# Patient Record
Sex: Male | Born: 1995 | Race: Black or African American | Hispanic: No | Marital: Single | Smoking: Current every day smoker
Health system: Southern US, Community
[De-identification: ages and names within clinical notes are randomized; demographics above are authoritative.]

---
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
## 2017-09-07 ENCOUNTER — Emergency Department
Admission: EM | Admit: 2017-09-07 | Discharge: 2017-09-07 | Disposition: A | Discharge status: Home / Self Care | Payer: Self-pay | Attending: Emergency Medicine | Admitting: Emergency Medicine

## 2017-09-07 ENCOUNTER — Other Ambulatory Visit: Payer: Self-pay

## 2017-09-07 ENCOUNTER — Encounter: Payer: Self-pay | Admitting: Emergency Medicine

## 2017-09-07 DIAGNOSIS — Z5321 Procedure and treatment not carried out due to patient leaving prior to being seen by health care provider: Secondary | ICD-10-CM | POA: Insufficient documentation

## 2017-09-07 DIAGNOSIS — K0889 Other specified disorders of teeth and supporting structures: Secondary | ICD-10-CM | POA: Insufficient documentation

## 2017-09-07 NOTE — ED Triage Notes (Signed)
Pt presents to ED with ulcer like sores in his mouth. Pt states they are on both sides of his mouth intermittently. Denies fever. No obvious wounds or sores. Pt denies injury.

## 2017-12-16 ENCOUNTER — Encounter: Payer: Self-pay | Admitting: Emergency Medicine

## 2017-12-16 ENCOUNTER — Emergency Department
Admission: EM | Admit: 2017-12-16 | Discharge: 2017-12-16 | Disposition: A | Discharge status: Home / Self Care | Payer: Self-pay | Attending: Emergency Medicine | Admitting: Emergency Medicine

## 2017-12-16 ENCOUNTER — Other Ambulatory Visit: Payer: Self-pay

## 2017-12-16 DIAGNOSIS — F17290 Nicotine dependence, other tobacco product, uncomplicated: Secondary | ICD-10-CM | POA: Insufficient documentation

## 2017-12-16 DIAGNOSIS — L42 Pityriasis rosea: Secondary | ICD-10-CM | POA: Insufficient documentation

## 2017-12-16 MED ORDER — TRIAMCINOLONE ACETONIDE 0.025 % EX OINT: 1.0000 "application " | TOPICAL_OINTMENT | Freq: Two times a day (BID) | CUTANEOUS | 0 refills | Status: AC

## 2017-12-16 NOTE — ED Triage Notes (Signed)
Patient ambulatory to triage with steady gait, without difficulty or distress noted; pt reports generalized itchy rash since last wk with no known cause; has not tired any OTC meds for relief

## 2017-12-16 NOTE — ED Provider Notes (Signed)
[facility] Emergency Department Provider Note  ____________________________________________  Time seen: Approximately 11:06 PM  I have reviewed the triage vital signs and the nursing notes.   HISTORY  Chief Complaint Rash    HPI Scott Atkinson is a 22 y.o. male presents to the emergency department with a pruritic, dry, maculopapular, flesh-colored, circumferential rash along the upper extremities and trunk that started with a larger lesion and was followed by multiple smaller lesions.  Patient experienced a prodrome of rhinorrhea, congestion and cough.  No pharyngitis.  Patient reports that symptoms have resolved aside from rash.  Patient denies a history of eczema.  No alleviating measures have been attempted.   History reviewed. No pertinent past medical history.  There are no active problems to display for this patient.   History reviewed. No pertinent surgical history.  Prior to Admission medications   Medication Sig Start Date End Date Taking? Authorizing Provider  triamcinolone (KENALOG) 0.025 % ointment Apply 1 application topically 2 (two) times daily for 7 days. 12/16/17 12/23/17  Orvil Feil, PA-C    Allergies Patient has no known allergies.  No family history on file.  Social History Social History   Tobacco Use  . Smoking status: Current Every Day Smoker    Types: E-cigarettes  . Smokeless tobacco: Former Engineer, water Use Topics  . Alcohol use: Not Currently  . Drug use: Never     Review of Systems  Constitutional: No fever/chills Eyes: No visual changes. No discharge ENT: No upper respiratory complaints. Cardiovascular: no chest pain. Respiratory: no cough. No SOB. Gastrointestinal: No abdominal pain.  No nausea, no vomiting.  No diarrhea.  No constipation. Musculoskeletal: Negative for musculoskeletal pain. Skin: Patient has rash.  Neurological: Negative for headaches, focal weakness or  numbness. ____________________________________________   PHYSICAL EXAM:  VITAL SIGNS: ED Triage Vitals  Enc Vitals Group     BP 12/16/17 2128 125/67     Pulse Rate 12/16/17 2128 83     Resp 12/16/17 2128 18     Temp 12/16/17 2128 97.9 F (36.6 C)     Temp Source 12/16/17 2128 Oral     SpO2 12/16/17 2128 100 %     Weight 12/16/17 2127 145 lb (65.8 kg)     Height 12/16/17 2127 5\' 7"  (1.702 m)     Head Circumference --      Peak Flow --      Pain Score 12/16/17 2126 0     Pain Loc --      Pain Edu? --      Excl. in GC? --      Constitutional: Alert and oriented. Well appearing and in no acute distress. Eyes: Conjunctivae are normal. PERRL. EOMI. Head: Atraumatic. Cardiovascular: Normal rate, regular rhythm. Normal S1 and S2.  Good peripheral circulation. Respiratory: Normal respiratory effort without tachypnea or retractions. Lungs CTAB. Good air entry to the bases with no decreased or absent breath sounds. Musculoskeletal: Full range of motion to all extremities. No gross deformities appreciated. Neurologic:  Normal speech and language. No gross focal neurologic deficits are appreciated.  Skin: Patient has dry, maculopapular, scaling, circumferential rash along the upper extremities and trunk with a single herald patch. Psychiatric: Mood and affect are normal. Speech and behavior are normal. Patient exhibits appropriate insight and judgement.   ____________________________________________   LABS (all labs ordered are listed, but only abnormal results are displayed)  Labs Reviewed - No data to display ____________________________________________  EKG   ____________________________________________  RADIOLOGY  No results found.  ____________________________________________    PROCEDURES  Procedure(s) performed:    Procedures    Medications - No data to display   ____________________________________________   INITIAL IMPRESSION / ASSESSMENT AND PLAN  / ED COURSE  Pertinent labs & imaging results that were available during my care of the patient were reviewed by me and considered in my medical decision making (see chart for details).  Review of the [place] CSRS was performed in accordance of the [facility] prior to dispensing any controlled drugs.      Assessment and plan Pityriasis rosea Patient presents to the emergency department with a rash consistent with pityriasis rosea.  Patient education regarding the self-limiting nature of the condition was given.  Patient was discharged with triamcinolone cream for pruritus.  Patient was advised to follow-up with primary care as needed.   ____________________________________________  FINAL CLINICAL IMPRESSION(S) / ED DIAGNOSES  Final diagnoses:  Pityriasis rosea      NEW MEDICATIONS STARTED DURING THIS VISIT:  ED Discharge Orders         Ordered    triamcinolone (KENALOG) 0.025 % ointment  2 times daily     12/16/17 2256              This chart was dictated using voice recognition software/Dragon. Despite best efforts to proofread, errors can occur which can change the meaning. Any change was purely unintentional.    Orvil FeilWoods, Lexis Potenza M, PA-C 12/16/17 2310    Governor RooksLord, Rebecca, MD 12/20/17 1019

## 2018-02-01 LAB — HM HIV SCREENING LAB: HM HIV Screening: NEGATIVE

## 2018-10-12 ENCOUNTER — Ambulatory Visit: Payer: Self-pay

## 2019-12-13 ENCOUNTER — Emergency Department: Payer: 59

## 2019-12-13 ENCOUNTER — Emergency Department
Admission: EM | Admit: 2019-12-13 | Discharge: 2019-12-13 | Disposition: A | Discharge status: Home / Self Care | Payer: 59 | Attending: Emergency Medicine | Admitting: Emergency Medicine

## 2019-12-13 ENCOUNTER — Other Ambulatory Visit: Payer: Self-pay

## 2019-12-13 DIAGNOSIS — S63511A Sprain of carpal joint of right wrist, initial encounter: Secondary | ICD-10-CM

## 2019-12-13 DIAGNOSIS — Y929 Unspecified place or not applicable: Secondary | ICD-10-CM | POA: Diagnosis not present

## 2019-12-13 DIAGNOSIS — S63501A Unspecified sprain of right wrist, initial encounter: Secondary | ICD-10-CM | POA: Insufficient documentation

## 2019-12-13 DIAGNOSIS — Y9355 Activity, bike riding: Secondary | ICD-10-CM | POA: Diagnosis not present

## 2019-12-13 DIAGNOSIS — F17290 Nicotine dependence, other tobacco product, uncomplicated: Secondary | ICD-10-CM | POA: Insufficient documentation

## 2019-12-13 DIAGNOSIS — S6991XA Unspecified injury of right wrist, hand and finger(s), initial encounter: Secondary | ICD-10-CM | POA: Diagnosis present

## 2019-12-13 DIAGNOSIS — Y999 Unspecified external cause status: Secondary | ICD-10-CM | POA: Diagnosis not present

## 2019-12-13 MED ORDER — TRAMADOL HCL 50 MG PO TABS
50.0000 mg | ORAL_TABLET | Freq: Once | ORAL | Status: AC
  Administered 2019-12-13: 50 mg via ORAL
  Filled 2019-12-13: qty 1

## 2019-12-13 MED ORDER — IBUPROFEN 600 MG PO TABS: 600.0000 mg | ORAL_TABLET | Freq: Three times a day (TID) | ORAL | 0 refills | Status: AC | PRN

## 2019-12-13 MED ORDER — TRAMADOL HCL 50 MG PO TABS: 50.0000 mg | ORAL_TABLET | Freq: Four times a day (QID) | ORAL | 0 refills | Status: AC | PRN

## 2019-12-13 MED ORDER — IBUPROFEN 600 MG PO TABS
600.0000 mg | ORAL_TABLET | Freq: Once | ORAL | Status: AC
  Administered 2019-12-13: 600 mg via ORAL
  Filled 2019-12-13: qty 1

## 2019-12-13 NOTE — Discharge Instructions (Addendum)
Wear splint for 3 to 5 days.  Follow discharge care instruction take medication as directed.
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
## 2020-05-23 ENCOUNTER — Ambulatory Visit: Payer: Self-pay

## 2021-08-24 ENCOUNTER — Emergency Department
Admission: EM | Admit: 2021-08-24 | Discharge: 2021-08-24 | Disposition: A | Discharge status: Home / Self Care | Payer: 59 | Attending: Emergency Medicine | Admitting: Emergency Medicine

## 2021-08-24 ENCOUNTER — Emergency Department: Payer: 59

## 2021-08-24 ENCOUNTER — Other Ambulatory Visit: Payer: Self-pay

## 2021-08-24 DIAGNOSIS — I861 Scrotal varices: Secondary | ICD-10-CM | POA: Diagnosis not present

## 2021-08-24 DIAGNOSIS — N50812 Left testicular pain: Secondary | ICD-10-CM

## 2021-08-24 LAB — CBC WITH DIFFERENTIAL/PLATELET
Abs Immature Granulocytes: 0.01 10*3/uL (ref 0.00–0.07)
Basophils Absolute: 0 10*3/uL (ref 0.0–0.1)
Basophils Relative: 1 %
Eosinophils Absolute: 0.1 10*3/uL (ref 0.0–0.5)
Eosinophils Relative: 2 %
HCT: 45.7 % (ref 39.0–52.0)
Hemoglobin: 14.8 g/dL (ref 13.0–17.0)
Immature Granulocytes: 0 %
Lymphocytes Relative: 42 %
Lymphs Abs: 1.2 10*3/uL (ref 0.7–4.0)
MCH: 28.8 pg (ref 26.0–34.0)
MCHC: 32.4 g/dL (ref 30.0–36.0)
MCV: 89.1 fL (ref 80.0–100.0)
Monocytes Absolute: 0.3 10*3/uL (ref 0.1–1.0)
Monocytes Relative: 9 %
Neutro Abs: 1.4 10*3/uL — ABNORMAL LOW (ref 1.7–7.7)
Neutrophils Relative %: 46 %
Platelets: 195 10*3/uL (ref 150–400)
RBC: 5.13 MIL/uL (ref 4.22–5.81)
RDW: 10.7 % — ABNORMAL LOW (ref 11.5–15.5)
WBC: 2.9 10*3/uL — ABNORMAL LOW (ref 4.0–10.5)
nRBC: 0 % (ref 0.0–0.2)

## 2021-08-24 LAB — BASIC METABOLIC PANEL
Anion gap: 5 (ref 5–15)
BUN: 15 mg/dL (ref 6–20)
CO2: 29 mmol/L (ref 22–32)
Calcium: 9.1 mg/dL (ref 8.9–10.3)
Chloride: 103 mmol/L (ref 98–111)
Creatinine, Ser: 0.88 mg/dL (ref 0.61–1.24)
GFR, Estimated: >60 mL/min (ref >60)
Glucose, Bld: 87 mg/dL (ref 70–99)
Potassium: 4.1 mmol/L (ref 3.5–5.1)
Sodium: 137 mmol/L (ref 135–145)

## 2021-08-24 LAB — URINALYSIS, ROUTINE W REFLEX MICROSCOPIC
Bilirubin Urine: NEGATIVE
Glucose, UA: NEGATIVE mg/dL
Hgb urine dipstick: NEGATIVE
Ketones, ur: NEGATIVE mg/dL
Leukocytes,Ua: NEGATIVE
Nitrite: NEGATIVE
Protein, ur: NEGATIVE mg/dL
Specific Gravity, Urine: 1.03 (ref 1.005–1.030)
pH: 6 (ref 5.0–8.0)

## 2021-08-24 MED ORDER — NAPROXEN 500 MG PO TABS: 500.0000 mg | ORAL_TABLET | Freq: Two times a day (BID) | ORAL | 0 refills | Status: AC

## 2021-08-24 MED ORDER — KETOROLAC TROMETHAMINE 30 MG/ML IJ SOLN
15.0000 mg | Freq: Once | INTRAMUSCULAR | Status: AC
  Administered 2021-08-24: 15 mg via INTRAVENOUS
  Filled 2021-08-24: qty 1

## 2021-08-24 NOTE — ED Notes (Signed)
Pt a&ox4. Nadn. Vss. Pt with father. Dc education provided to father and pt per pt request to have father in room.

## 2021-08-24 NOTE — Discharge Instructions (Signed)
1.  Take Naprosyn as directed for pain. 2.  Wear athletic supporter while awake.  Elevate affected area whenever possible. 3.  Return to the ER for worsening symptoms, persistent vomiting, difficulty breathing or other concerns.

## 2021-08-24 NOTE — ED Provider Notes (Signed)
[facility] Provider Note    Event Date/Time   First MD Initiated Contact with Patient 08/24/21 [phone]     (approximate)   History   Testicle Pain   HPI  Scott Atkinson is a 26 y.o. male who presents to the ED from home with his father with a chief complaint of left testicle pain and swelling.  Patient awoke at 3 AM, got in the shower and noted the above symptoms.  Denies recent injury/trauma/fall.  No recent sexual intercourse.  Denies fever, chills, abdominal pain, urethral discharge, dysuria, nausea or vomiting.  Just had STD check last week and states panel was negative     Past Medical History  History reviewed. No pertinent past medical history.   Active Problem List  There are no problems to display for this patient.    Past Surgical History  History reviewed. No pertinent surgical history.   Home Medications   Prior to Admission medications   Medication Sig Start Date End Date Taking? Authorizing Provider  naproxen (NAPROSYN) 500 MG tablet Take 1 tablet (500 mg total) by mouth 2 (two) times daily with a meal. 08/24/21  Yes Paulette Blanch, MD  ibuprofen (ADVIL) 600 MG tablet Take 1 tablet (600 mg total) by mouth every 8 (eight) hours as needed. 12/13/19   Sable Feil, PA-C  traMADol (ULTRAM) 50 MG tablet Take 1 tablet (50 mg total) by mouth every 6 (six) hours as needed for moderate pain. 12/13/19   Sable Feil, PA-C     Allergies  Patient has no known allergies.   Family History  History reviewed. No pertinent family history.   Physical Exam  Triage Vital Signs: ED Triage Vitals  Enc Vitals Group     BP 08/24/21 0516 114/73     Pulse Rate 08/24/21 0516 86     Resp 08/24/21 0516 18     Temp 08/24/21 0516 98 F (36.7 C)     Temp Source 08/24/21 0516 Oral     SpO2 08/24/21 0516 99 %     Weight 08/24/21 0513 140 lb (63.5 kg)     Height 08/24/21 0513 5\' 8"  (1.727 m)     Head Circumference --      Peak Flow --      Pain  Score 08/24/21 0513 10     Pain Loc --      Pain Edu? --      Excl. in [place]? --     Updated Vital Signs: BP 111/77 (BP Location: Left Arm)   Pulse 87   Temp 98 F (36.7 C) (Oral)   Resp 16   Ht 5\' 8"  (1.727 m)   Wt 63.5 kg   SpO2 99%   BMI 21.29 kg/m    General: Awake, no distress.  CV:  RRR.  Good peripheral perfusion.  Resp:  Normal effort.  CTA B. Abd:  Nontender to light or deep palpation.  No distention.  Other:  Circumcised male.  No vesicles, ulcer or chancre.  Bilaterally distended testicles which are not visibly swollen.  Left testicle tender to palpation.  Strong bilateral cremasteric reflexes.  No inguinal lymphadenopathy.   ED Results / Procedures / Treatments  Labs (all labs ordered are listed, but only abnormal results are displayed) Labs Reviewed  CBC WITH DIFFERENTIAL/PLATELET - Abnormal; Notable for the following components:      Result Value   WBC 2.9 (*)    RDW 10.7 (*)  Neutro Abs 1.4 (*)    All other components within normal limits  URINALYSIS, ROUTINE W REFLEX MICROSCOPIC - Abnormal; Notable for the following components:   Color, Urine YELLOW (*)    APPearance HAZY (*)    All other components within normal limits  BASIC METABOLIC PANEL     EKG  None   RADIOLOGY I have independently visualized and interpreted patient's ultrasound as well as noted the radiology interpretation:  Ultrasound scrotum: Left varicocele  Official radiology report(s): US Scrotum  Result Date: 08/24/2021 CLINICAL DATA:  Left testicle pain for 1 day. EXAM: SCROTAL ULTRASOUND DOPPLER ULTRASOUND OF THE TESTICLES TECHNIQUE: Complete ultrasound examination of the testicles, epididymis, and other scrotal structures was performed. Color and spectral Doppler ultrasound were also utilized to evaluate blood flow to the testicles. COMPARISON:  None Available. FINDINGS: Right testicle Measurements: 4.2 x 2.5 x 3.1 cm. No mass or microlithiasis visualized. Left testicle  Measurements: 4.9 x 2.6 x 3.2 cm. No mass or microlithiasis visualized. Right epididymis:  Normal in size and appearance. Left epididymis:  Normal in size and appearance. Hydrocele:  None visualized. Varicocele: Mild varicocele is present on the left with veins measuring up to 3.5 mm. No significant changes present with Valsalva. Pulsed Doppler interrogation of both testes demonstrates normal low resistance arterial and venous waveforms bilaterally. IMPRESSION: 1. Normal sonographic appearance of the testicles bilaterally. 2. Normal arterial and venous waveforms and symmetric color Doppler analysis. 3. Mild left-sided varicocele. Electronically Signed   By: San Morelle M.D.   On: 08/24/2021 06:36     PROCEDURES:  Critical Care performed: No  Procedures   MEDICATIONS ORDERED IN ED: Medications  ketorolac (TORADOL) 30 MG/ML injection 15 mg (15 mg Intravenous Given 08/24/21 0544)     IMPRESSION / MDM / ASSESSMENT AND PLAN / ED COURSE  I reviewed the triage vital signs and the nursing notes.                             26 year old male presenting with left testicular pain.  I have identified this patient to have a potentially life-threatening condition. Differential diagnosis includes, but is not limited to, acute appendicitis, renal colic, testicular torsion, urinary tract infection/pyelonephritis, prostatitis,  epididymitis, diverticulitis, small bowel obstruction or ileus, colitis, abdominal aortic aneurysm, gastroenteritis, hernia, etc.   I have personally reviewed patient's records and see that he had a annual physical exam by his PCP on 07/28/2021.  We will obtain basic lab work, UA, testicular ultrasound.  Administer IV ketorolac for pain.  Will reassess.  Clinical Course as of 08/24/21 [id number]  Sun Aug 24, 2021  0650 Laboratory results unremarkable, UA negative.  Ultrasound demonstrates left varicocele.  Will discharge home with prescription for Naprosyn, encouraged athletic  supporter and elevation and patient will follow-up with urology as needed.  Strict return precautions given.  Patient and father verbalized understanding and agree with plan of care. [JS]    Clinical Course User Index [JS] Paulette Blanch, MD     FINAL CLINICAL IMPRESSION(S) / ED DIAGNOSES   Final diagnoses:  Pain in left testicle  Varicocele     Rx / DC Orders   ED Discharge Orders          Ordered    naproxen (NAPROSYN) 500 MG tablet  2 times daily with meals        08/24/21 0642             Note:  This document was prepared using Dragon voice recognition software and may include unintentional dictation errors.   Paulette Blanch, MD 08/24/21 [phone]

## 2021-08-24 NOTE — ED Triage Notes (Signed)
Ambulatory to triage with c/o left testicle swelling, onset apx 0300 this morning. Denies injury to area. Denies urinary sx.

## 2022-08-03 ENCOUNTER — Emergency Department
Admission: EM | Admit: 2022-08-03 | Discharge: 2022-08-03 | Disposition: A | Discharge status: Home / Self Care | Payer: 59 | Attending: Emergency Medicine | Admitting: Emergency Medicine

## 2022-08-03 ENCOUNTER — Other Ambulatory Visit: Payer: Self-pay

## 2022-08-03 DIAGNOSIS — R300 Dysuria: Secondary | ICD-10-CM | POA: Insufficient documentation

## 2022-08-03 LAB — CHLAMYDIA/NGC RT PCR (ARMC ONLY)
Chlamydia Tr: NOT DETECTED
N gonorrhoeae: NOT DETECTED

## 2022-08-03 LAB — WET PREP, GENITAL
Clue Cells Wet Prep HPF POC: NONE SEEN
Sperm: NONE SEEN
Trich, Wet Prep: NONE SEEN
WBC, Wet Prep HPF POC: <10 (ref <10)
Yeast Wet Prep HPF POC: NONE SEEN

## 2022-08-03 LAB — URINALYSIS, ROUTINE W REFLEX MICROSCOPIC
Bacteria, UA: NONE SEEN
Bilirubin Urine: NEGATIVE
Glucose, UA: NEGATIVE mg/dL
Ketones, ur: 20 mg/dL — AB
Leukocytes,Ua: NEGATIVE
Nitrite: NEGATIVE
Protein, ur: 100 mg/dL — AB
Specific Gravity, Urine: 1.024 (ref 1.005–1.030)
Squamous Epithelial / HPF: NONE SEEN /HPF (ref 0–5)
pH: 5 (ref 5.0–8.0)

## 2022-08-03 LAB — HIV ANTIBODY (ROUTINE TESTING W REFLEX): HIV Screen 4th Generation wRfx: NONREACTIVE

## 2022-08-03 NOTE — ED Triage Notes (Signed)
Pt comes with c/o 3 days of urinary burning, pain, odor and some discharge.

## 2022-08-03 NOTE — Discharge Instructions (Signed)
The gonorrhea, chlamydia, and trichomonas tests are negative.  The syphilis and HIV results have not yet returned.  You may find these results on MyChart.  Remember that there are many other STDs that and you should follow up with a primary care doctor to have the full panel of testing performed. Please do not have sexual intercourse until fully and properly tested and treated. Your partner also needs to be tested and treated.

## 2022-08-03 NOTE — ED Provider Notes (Signed)
[facility] Provider Note    Event Date/Time   First MD Initiated Contact with Patient 08/03/22 [phone]     (approximate)   History      HPI  Scott Atkinson is a 27 y.o. male who presents today for evaluation of burning with urination that began this morning.  Patient reports that he was recently sexually active with 1 male partner 1 week ago, but he has since learned that she has had intercourse with many other men.  He is requesting to be tested for sexually transmitted diseases.  No testicular pain or swelling.  He has not noticed any lesions in that area.  No abdominal pain or flank pain.  No fevers or chills.  He has not noticed any discharge or lesions.  There are no problems to display for this patient.         Physical Exam   Triage Vital Signs: ED Triage Vitals  Enc Vitals Group     BP 08/03/22 0815 (!) 158/103     Pulse Rate 08/03/22 0815 (!) 102     Resp 08/03/22 0815 18     Temp 08/03/22 0815 98.7 F (37.1 C)     Temp Source 08/03/22 0815 Oral     SpO2 08/03/22 0815 99 %     Weight --      Height --      Head Circumference --      Peak Flow --      Pain Score 08/03/22 0814 6     Pain Loc --      Pain Edu? --      Excl. in GC? --     Most recent vital signs: Vitals:   08/03/22 0815  BP: (!) 158/103  Pulse: (!) 102  Resp: 18  Temp: 98.7 F (37.1 C)  SpO2: 99%    Physical Exam Vitals and nursing note reviewed.  Constitutional:      General: Awake and alert. No acute distress.    Appearance: Normal appearance. The patient is normal weight.  HENT:     Head: Normocephalic and atraumatic.     Mouth: Mucous membranes are moist.  Eyes:     General: PERRL. Normal EOMs        Right eye: No discharge.        Left eye: No discharge.     Conjunctiva/sclera: Conjunctivae normal.  Cardiovascular:     Rate and Rhythm: Normal rate and regular rhythm.     Pulses: Normal pulses.  Pulmonary:     Effort: Pulmonary effort is  normal. No respiratory distress.     Breath sounds: Normal breath sounds.  Abdominal:     Abdomen is soft. There is no abdominal tenderness. No rebound or guarding. No distention. Declined GU exam Musculoskeletal:        General: No swelling. Normal range of motion.     Cervical back: Normal range of motion and neck supple.  Skin:    General: Skin is warm and dry.     Capillary Refill: Capillary refill takes less than 2 seconds.     Findings: No rash.  Neurological:     Mental Status: The patient is awake and alert.      ED Results / Procedures / Treatments   Labs (all labs ordered are listed, but only abnormal results are displayed) Labs Reviewed  URINALYSIS, ROUTINE W REFLEX MICROSCOPIC - Abnormal; Notable for the following components:      Result  Value   Color, Urine YELLOW (*)    APPearance CLEAR (*)    Hgb urine dipstick SMALL (*)    Ketones, ur 20 (*)    Protein, ur 100 (*)    All other components within normal limits  CHLAMYDIA/NGC RT PCR ([facility] ONLY)            WET PREP, GENITAL  HIV ANTIBODY (ROUTINE TESTING W REFLEX)  RPR     EKG     RADIOLOGY     PROCEDURES:  Critical Care performed:   Procedures   MEDICATIONS ORDERED IN ED: Medications - No data to display   IMPRESSION / MDM / ASSESSMENT AND PLAN / ED COURSE  I reviewed the triage vital signs and the nursing notes.   Differential diagnosis includes, but is not limited to, gonorrhea, chlamydia, other sexually transmitted disease, urinary tract infection, less likely testicular etiology given lack of testicular pain or swelling.  Urinalysis reveals no leukocytes, bacteria, or nitrites.  Gonorrhea and Chlamydia are negative.  Trichomonas is also negative.  Patient was instructed that he can find the results of the RPR and HIV on MyChart. Patient was advised that there are many other STDs that patient could have, that we do not test for in the emergency department. Patient was advised to follow  up with a primary care doctor to have the full panel of testing performed. Patient was advised to not have sexual intercourse until fully and properly tested and treated. Patient was advised that his partner also needs to be tested and treated. Patient understands and agrees with plan.  He was discharged in stable condition.   Patient's presentation is most consistent with acute complicated illness / injury requiring diagnostic workup.    FINAL CLINICAL IMPRESSION(S) / ED DIAGNOSES   Final diagnoses:  Dysuria     Rx / DC Orders   ED Discharge Orders     None        Note:  This document was prepared using Dragon voice recognition software and may include unintentional dictation errors.   Keturah Shavers 08/03/22 1223    Minna Antis, MD 08/03/22 1523

## 2022-08-04 LAB — RPR: RPR Ser Ql: NONREACTIVE

## 2022-12-04 ENCOUNTER — Ambulatory Visit: Payer: Self-pay

## 2022-12-18 ENCOUNTER — Ambulatory Visit: Payer: 59 | Admitting: Family Medicine

## 2022-12-18 DIAGNOSIS — Z114 Encounter for screening for human immunodeficiency virus [HIV]: Secondary | ICD-10-CM

## 2022-12-18 DIAGNOSIS — Z708 Other sex counseling: Secondary | ICD-10-CM

## 2022-12-18 DIAGNOSIS — Z113 Encounter for screening for infections with a predominantly sexual mode of transmission: Secondary | ICD-10-CM

## 2022-12-18 LAB — HM HIV SCREENING LAB: HM HIV Screening: NEGATIVE

## 2022-12-18 NOTE — Progress Notes (Signed)
[facility] Department STI clinic/screening visit  Subjective:  Scott Atkinson is a 27 y.o. male being seen today for an [facility]I screening visit in [facility] The patient reports they do not have symptoms.    Patient has the following medical conditions:  There are no problems to display for this patient.   Chief Complaint  Patient presents with   SEXUALLY TRANSMITTED DISEASE    No symptoms    Last HIV test per patient/review of record was  Lab Results  Component Value Date   HMHIVSCREEN Negative - Validated 02/01/2018    Lab Results  Component Value Date   HIV Non Reactive 08/03/2022    Does the patient or their partner desires a pregnancy in the next year? No  Screening for MPX risk: Does the patient have an unexplained rash? No Is the patient MSM? No Does the patient endorse multiple sex partners or anonymous sex partners? No Did the patient have close or sexual contact with a person diagnosed with MPX? No Has the patient traveled outside the [place] where MPX is endemic? No Is there a high clinical suspicion for MPX-- evidenced by one of the following No  -Unlikely to be chickenpox  -Lymphadenopathy  -Rash that present in same phase of evolution on any given body part   See flowsheet for further details and programmatic requirements.    There is no immunization history on file for this patient.   The following portions of the patient's history were reviewed and updated as appropriate: allergies, current medications, past medical history, past social history, past surgical history and problem list.  Objective:  There were no vitals filed for this visit.  Patient seen by RN only. Self collected samples.  Assessment and Plan:  Scott Atkinson is a 27 y.o. male presenting to the [facility] Department for STI screening in [facility].  1. Screening examination for venereal disease  - Chlamydia/Gonorrhea [place] [facility] - HIV [place]  LAB - Syphilis Serology, [facility] - Chlamydia/GC NAA, Confirmation   Patient does not have STI symptoms Patient accepted all screenings including  urine GC/Chlamydia, and blood work for HIV/Syphilis. Patient meets criteria for HepB screening? No. Ordered? no Patient meets criteria for HepC screening? No. Ordered? no Recommended condom use with all sex Discussed importance of condom use for STI prevent  Treat positive test results per standing order. Discussed time line for [facility] results and that patient will be called with positive results and encouraged patient to call if he had not heard in 2 weeks Recommended repeat testing in 3 months with positive results.  No follow-ups on file.  No future appointments.  Gaspar Garbe, RN

## 2022-12-23 LAB — CHLAMYDIA/GC NAA, CONFIRMATION
Chlamydia trachomatis, NAA: NEGATIVE
Neisseria gonorrhoeae, NAA: NEGATIVE

## 2022-12-24 NOTE — Progress Notes (Signed)
Attestation of Attending Supervision: Evaluation, management, and procedures were performed by standing order with my collaboration.  I have reviewed the  note and chart, and I agree with the management and plan.  Fayette Pho, MD Clinical Services Medical Director [facility] Department 12/24/22 10:15 PM

## 2023-10-19 IMAGING — US US SCROTUM
1 series · 14 of 25 positions shown · non-contrast
Comparison: None Available.

CLINICAL DATA: Left testicle pain for 1 day.

EXAM:
SCROTAL ULTRASOUND
DOPPLER ULTRASOUND OF THE TESTICLES
TECHNIQUE: Complete ultrasound examination of the testicles, epididymis, and
other scrotal structures was performed. Color and spectral Doppler
ultrasound were also utilized to evaluate blood flow to the
testicles.

[Series 1: us scrotum · 14 of 57 slices shown]
[im 1/57]
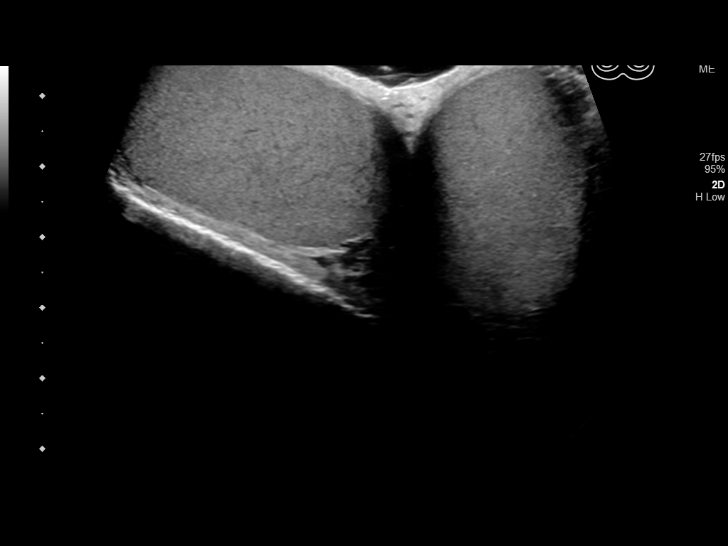
[im 5/57]
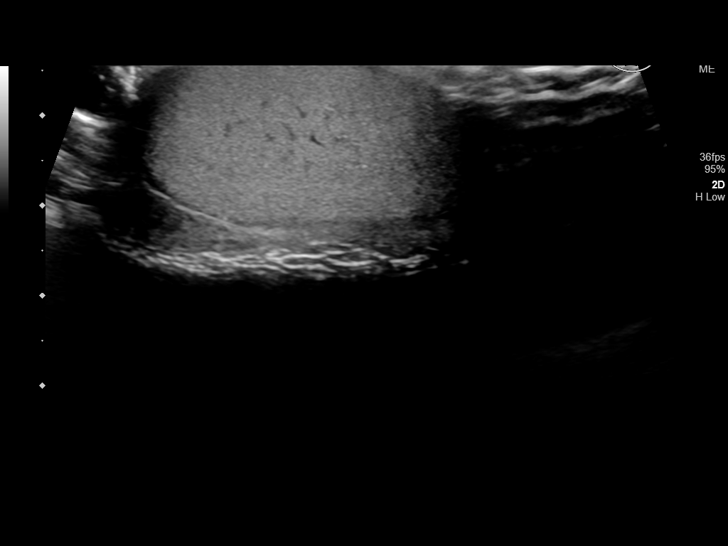
[im 10/57]
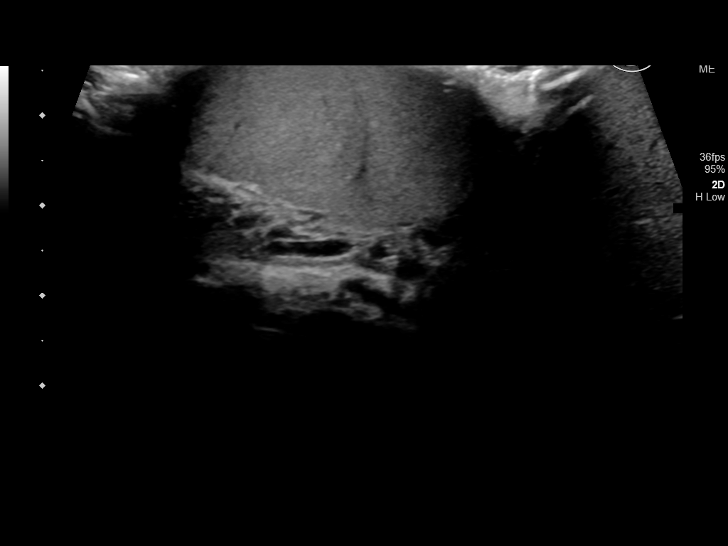
[im 15/57]
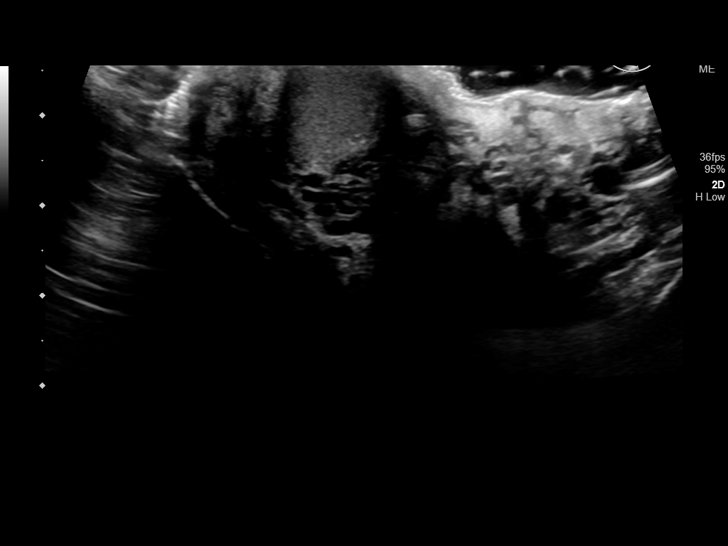
[im 19/57]
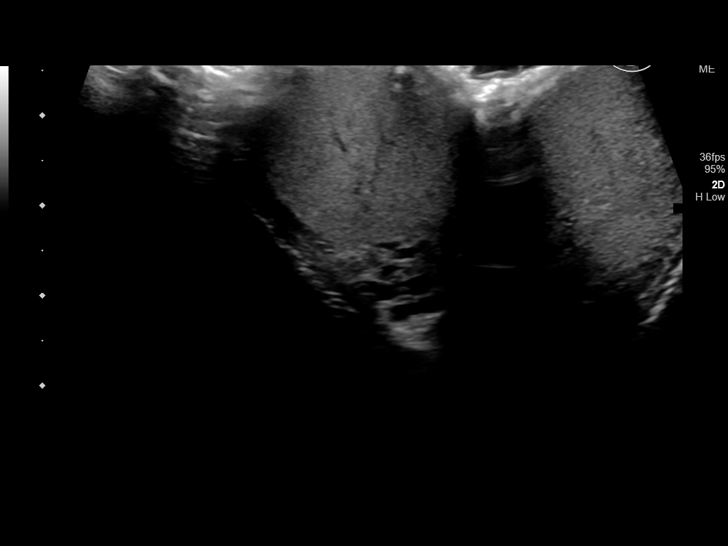
[im 22/57]
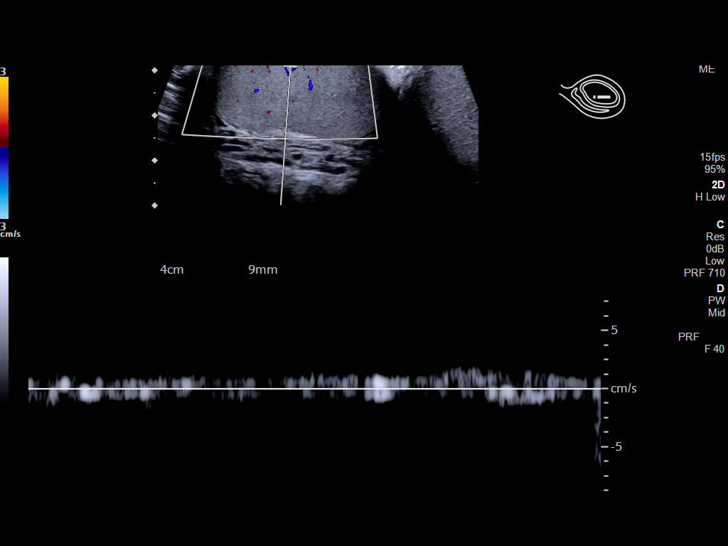
[im 26/57]
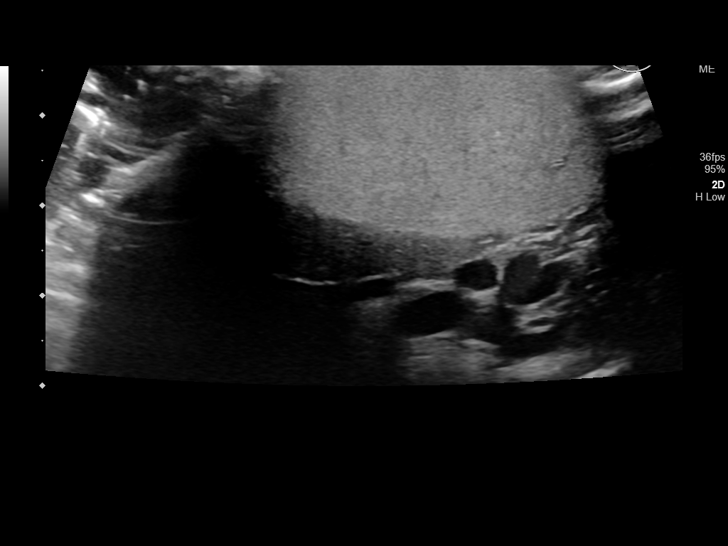
[im 31/57]
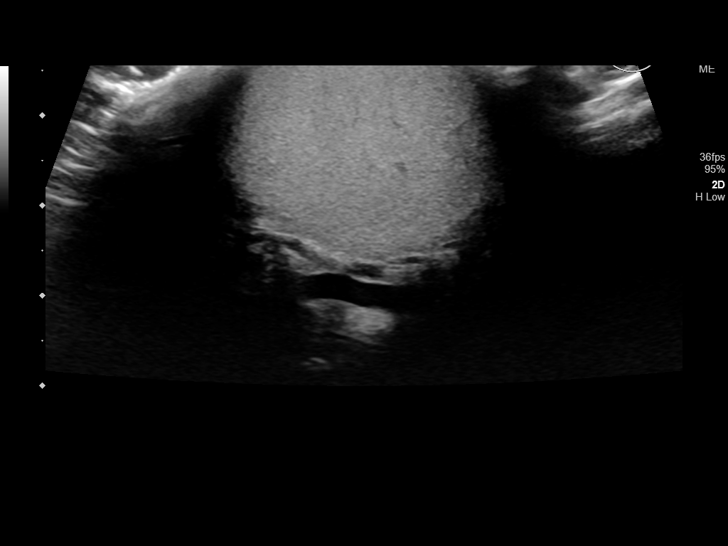
[im 36/57]
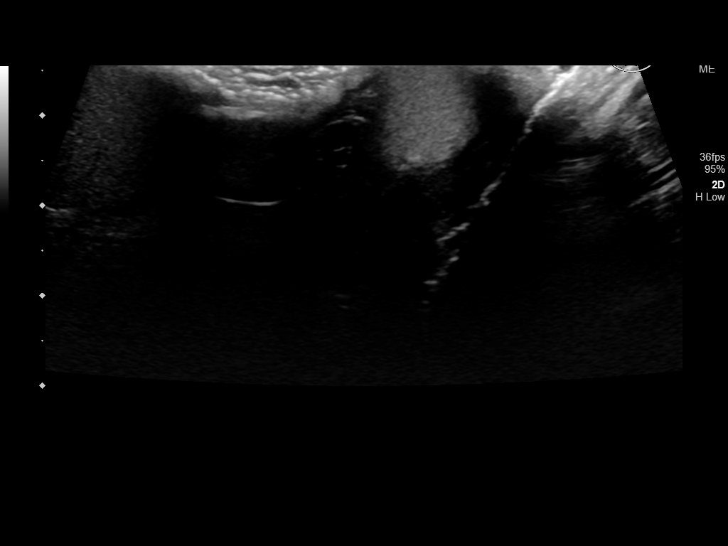
[im 38/57]
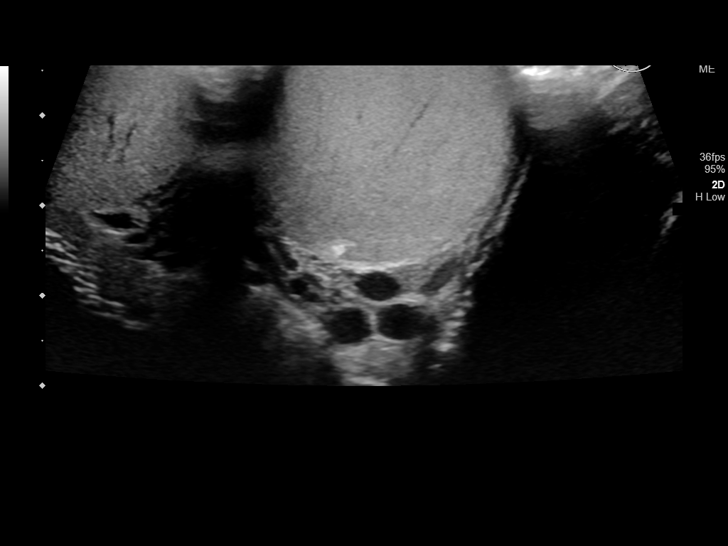
[im 43/57]
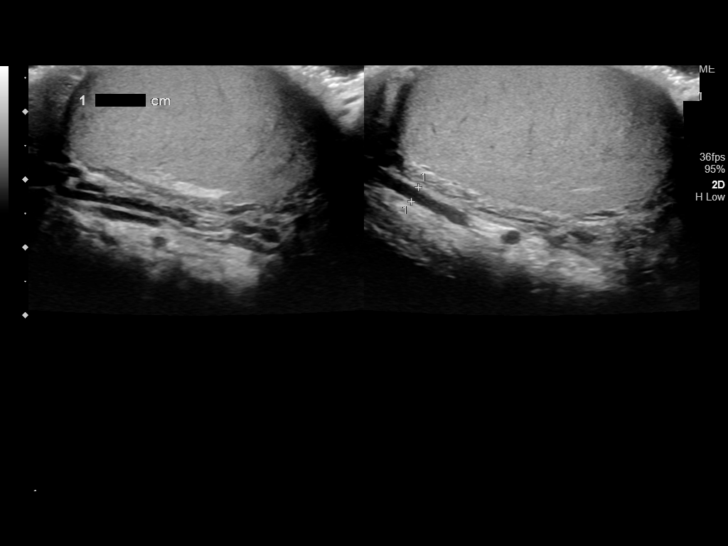
[im 47/57]
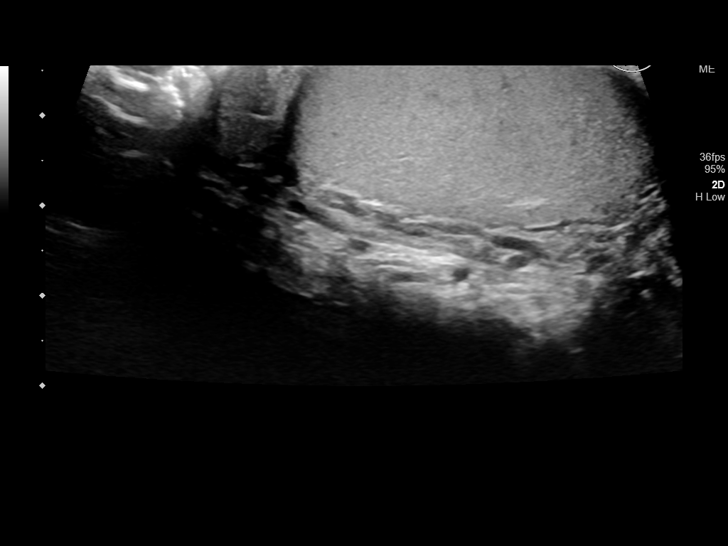
[im 52/57]
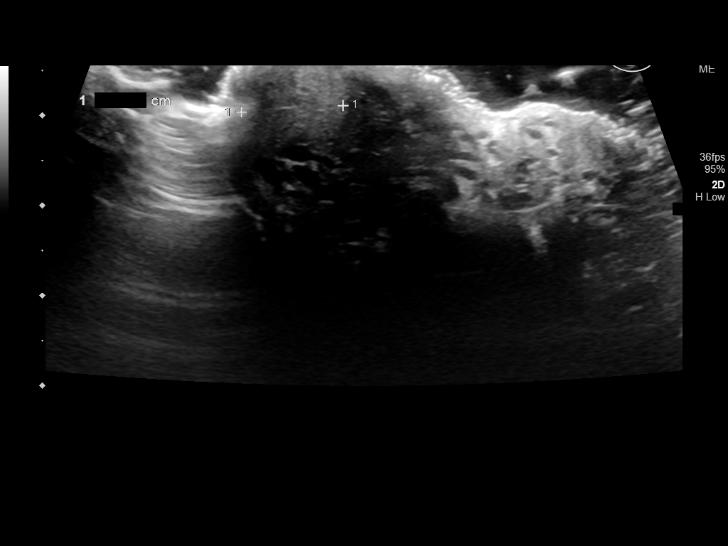
[im 57/57]
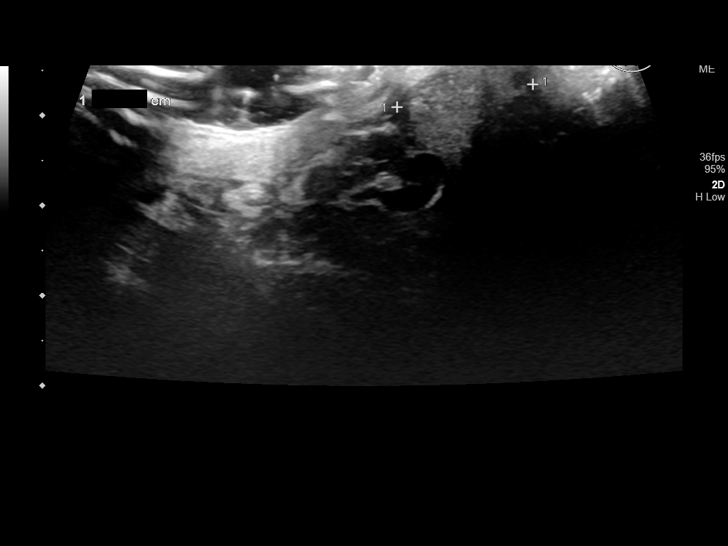

[14 of 25 positions shown; findings below may reference images not displayed]

FINDINGS: Right testicle

Measurements: 4.2 x 2.5 x 3.1 cm. No mass or microlithiasis
visualized.

Left testicle

Measurements: 4.9 x 2.6 x 3.2 cm. No mass or microlithiasis
visualized.

Right epididymis:  Normal in size and appearance.

Left epididymis:  Normal in size and appearance.

Hydrocele:  None visualized.

Varicocele: Mild varicocele is present on the left with veins
measuring up to 3.5 mm. No significant changes present with
Valsalva.

Pulsed Doppler interrogation of both testes demonstrates normal low
resistance arterial and venous waveforms bilaterally.
IMPRESSION: 1. Normal sonographic appearance of the testicles bilaterally.
2. Normal arterial and venous waveforms and symmetric color Doppler
analysis.
3. Mild left-sided varicocele.
